# Patient Record
Sex: Male | Born: 1980 | Race: White | Hispanic: No | Marital: Single | Smoking: Never smoker
Health system: Southern US, Community
[De-identification: ages and names within clinical notes are randomized; demographics above are authoritative.]

## PROBLEM LIST (undated history)

## (undated) DIAGNOSIS — N200 Calculus of kidney: Secondary | ICD-10-CM

## (undated) DIAGNOSIS — I10 Essential (primary) hypertension: Secondary | ICD-10-CM

## (undated) DIAGNOSIS — N289 Disorder of kidney and ureter, unspecified: Secondary | ICD-10-CM

## (undated) DIAGNOSIS — E162 Hypoglycemia, unspecified: Secondary | ICD-10-CM

## (undated) HISTORY — PX: EYE SURGERY: SHX253

## (undated) HISTORY — PX: LITHOTRIPSY: SUR834

## (undated) HISTORY — PX: KNEE SURGERY: SHX244

---

## 2013-08-29 ENCOUNTER — Encounter (HOSPITAL_COMMUNITY): Payer: Self-pay | Admitting: Emergency Medicine

## 2013-08-29 ENCOUNTER — Emergency Department (HOSPITAL_COMMUNITY)

## 2013-08-29 ENCOUNTER — Emergency Department (HOSPITAL_COMMUNITY)
Admission: EM | Admit: 2013-08-29 | Discharge: 2013-08-29 | Disposition: A | Discharge status: Home / Self Care | Attending: Emergency Medicine | Admitting: Emergency Medicine

## 2013-08-29 DIAGNOSIS — S61219A Laceration without foreign body of unspecified finger without damage to nail, initial encounter: Secondary | ICD-10-CM

## 2013-08-29 DIAGNOSIS — Z87442 Personal history of urinary calculi: Secondary | ICD-10-CM | POA: Insufficient documentation

## 2013-08-29 DIAGNOSIS — Z79899 Other long term (current) drug therapy: Secondary | ICD-10-CM | POA: Insufficient documentation

## 2013-08-29 DIAGNOSIS — Z862 Personal history of diseases of the blood and blood-forming organs and certain disorders involving the immune mechanism: Secondary | ICD-10-CM | POA: Insufficient documentation

## 2013-08-29 DIAGNOSIS — S61209A Unspecified open wound of unspecified finger without damage to nail, initial encounter: Secondary | ICD-10-CM | POA: Insufficient documentation

## 2013-08-29 DIAGNOSIS — Y9389 Activity, other specified: Secondary | ICD-10-CM | POA: Insufficient documentation

## 2013-08-29 DIAGNOSIS — I10 Essential (primary) hypertension: Secondary | ICD-10-CM | POA: Insufficient documentation

## 2013-08-29 DIAGNOSIS — Y929 Unspecified place or not applicable: Secondary | ICD-10-CM | POA: Insufficient documentation

## 2013-08-29 DIAGNOSIS — W230XXA Caught, crushed, jammed, or pinched between moving objects, initial encounter: Secondary | ICD-10-CM | POA: Insufficient documentation

## 2013-08-29 DIAGNOSIS — Z8639 Personal history of other endocrine, nutritional and metabolic disease: Secondary | ICD-10-CM | POA: Insufficient documentation

## 2013-08-29 HISTORY — DX: Essential (primary) hypertension: I10

## 2013-08-29 HISTORY — DX: Disorder of kidney and ureter, unspecified: N28.9

## 2013-08-29 HISTORY — DX: Hypoglycemia, unspecified: E16.2

## 2013-08-29 MED ORDER — IBUPROFEN 800 MG PO TABS
800.0000 mg | ORAL_TABLET | Freq: Once | ORAL | Status: AC
  Administered 2013-08-29: 800 mg via ORAL
  Filled 2013-08-29: qty 1

## 2013-08-29 NOTE — ED Notes (Signed)
Pt reports accidentally got left ring finger caught between 2 dumbells.  Pt has laceration to left ring finger.

## 2013-08-29 NOTE — ED Provider Notes (Signed)
CSN: [id number]     Arrival date & time 08/29/13  1301 History   First MD Initiated Contact with Patient 08/29/13 1522     Chief Complaint  Patient presents with  . Laceration   (Consider location/radiation/quality/duration/timing/severity/associated sxs/prior Treatment) Patient is a 32 y.o. male presenting with skin laceration. The history is provided by the patient.  Laceration Location:  Finger Finger laceration location:  L ring finger Depth:  Cutaneous Quality: jagged   Bleeding: controlled   Injury mechanism: finger mashed between 2 dumbells. Pain details:    Quality:  Aching   Severity:  Moderate   Timing:  Constant   Progression:  Unchanged Foreign body present:  No foreign bodies Worsened by:  Movement Tetanus status:  Up to date   Past Medical History  Diagnosis Date  . Hypertension   . Hypoglycemia   . Renal disorder     kidney stones   Past Surgical History  Procedure Laterality Date  . Knee surgery    . Lithotripsy    . Eye surgery     No family history on file. History  Substance Use Topics  . Smoking status: Never Smoker   . Smokeless tobacco: Not on file  . Alcohol Use: No    Review of Systems  Constitutional: Negative for activity change.       All ROS Neg except as noted in HPI  HENT: Negative for nosebleeds.   Eyes: Negative for photophobia and discharge.  Respiratory: Negative for cough, shortness of breath and wheezing.   Cardiovascular: Negative for chest pain and palpitations.  Gastrointestinal: Negative for abdominal pain and blood in stool.  Genitourinary: Negative for dysuria, frequency and hematuria.  Musculoskeletal: Negative for arthralgias, back pain and neck pain.  Skin: Negative.   Neurological: Negative for dizziness, seizures and speech difficulty.  Psychiatric/Behavioral: Negative for hallucinations and confusion.    Allergies  Review of patient's allergies indicates no known allergies.  Home Medications   Current  Outpatient Rx  Name  Route  Sig  Dispense  Refill  . atenolol (TENORMIN) 25 MG tablet   Oral   Take 25 mg by mouth daily.         Marland Kitchen triamterene-hydrochlorothiazide (MAXZIDE-25) 37.5-25 MG per tablet   Oral   Take 1 tablet by mouth daily.          BP 172/93  Pulse 71  Temp(Src) 98.2 F (36.8 C) (Oral)  Resp 20  Ht 6\' 2"  (1.88 m)  Wt 280 lb (127.007 kg)  BMI 35.93 kg/m2  SpO2 97% Physical Exam  Nursing note and vitals reviewed. Constitutional: He is oriented to person, place, and time. He appears well-developed and well-nourished.  Non-toxic appearance.  HENT:  Head: Normocephalic.  Right Ear: Tympanic membrane and external ear normal.  Left Ear: Tympanic membrane and external ear normal.  Eyes: EOM and lids are normal. Pupils are equal, round, and reactive to light.  Neck: Normal range of motion. Neck supple. Carotid bruit is not present.  Cardiovascular: Normal rate, regular rhythm, normal heart sounds, intact distal pulses and normal pulses.   Pulmonary/Chest: Breath sounds normal. No respiratory distress.  Abdominal: Soft. Bowel sounds are normal. There is no tenderness. There is no guarding.  Musculoskeletal: Normal range of motion.  2.6cm laceration of the left ring finger. Good cap refill. No tendon involvement.    Lymphadenopathy:       Head (right side): No submandibular adenopathy present.       Head (left side): No  submandibular adenopathy present.    He has no cervical adenopathy.  Neurological: He is alert and oriented to person, place, and time. He has normal strength. No cranial nerve deficit or sensory deficit.  Skin: Skin is warm and dry.  Psychiatric: He has a normal mood and affect. His speech is normal.    ED Course  LACERATION REPAIR Date/Time: 08/29/2013 4:30 PM Performed by: Kathie Dike Authorized by: Kathie Dike Consent: Verbal consent obtained. Risks and benefits: risks, benefits and alternatives were discussed Consent given by:  patient Patient understanding: patient states understanding of the procedure being performed Patient identity confirmed: arm band Time out: Immediately prior to procedure a "time out" was called to verify the correct patient, procedure, equipment, support staff and site/side marked as required. Body area: upper extremity Location details: left index finger Laceration length: 2.6 cm Foreign bodies: no foreign bodies Tendon involvement: none Nerve involvement: none Anesthesia: digital block Local anesthetic: bupivacaine 0.25% without epinephrine Patient sedated: no Preparation: Patient was prepped and draped in the usual sterile fashion. Irrigation solution: saline Amount of cleaning: standard Debridement: none Skin closure: 4-0 nylon Number of sutures: 8 Technique: simple Approximation: close Approximation difficulty: simple Dressing: splint Patient tolerance: Patient tolerated the procedure well with no immediate complications.   (including critical care time) Labs Review Labs Reviewed - No data to display Imaging Review Dg Finger Ring Left  08/29/2013   CLINICAL DATA:  Crush injury of the left ring finger  EXAM: LEFT RING FINGER 2+V  COMPARISON:  None  FINDINGS: There is no evidence of fracture or dislocation. There is no evidence of arthropathy or other focal bone abnormality. Soft tissues are unremarkable.  IMPRESSION: There is no evidence of acute fracture or dislocation of the left 4th finger.   Electronically Signed   By: David  [place]   On: 08/29/2013 13:36    EKG Interpretation   None       MDM  No diagnosis found. *I have reviewed nursing notes, vital signs, and all appropriate lab and imaging results for this patient.**  X-ray of the left ring finger is negative for fracture or dislocation.  Laceration to the left ring finger repaired without problem. Dressing and splint applied to the ring finger. Patient instructed to have sutures removed in 7 days. He will  return sooner if any changes, or signs of infection.  Kathie Dike, PA-C 08/29/13 1712

## 2013-08-30 NOTE — ED Provider Notes (Signed)
Medical screening examination/treatment/procedure(s) were performed by non-physician practitioner and as supervising physician I was immediately available for consultation/collaboration.  EKG Interpretation   None        Donnetta Hutching, MD 08/30/13 1457

## 2014-02-22 ENCOUNTER — Emergency Department (HOSPITAL_COMMUNITY)
Admission: EM | Admit: 2014-02-22 | Discharge: 2014-02-23 | Attending: Emergency Medicine | Admitting: Emergency Medicine

## 2014-02-22 ENCOUNTER — Emergency Department (HOSPITAL_COMMUNITY)

## 2014-02-22 ENCOUNTER — Encounter (HOSPITAL_COMMUNITY): Payer: Self-pay | Admitting: Emergency Medicine

## 2014-02-22 DIAGNOSIS — Z79899 Other long term (current) drug therapy: Secondary | ICD-10-CM | POA: Insufficient documentation

## 2014-02-22 DIAGNOSIS — G43909 Migraine, unspecified, not intractable, without status migrainosus: Secondary | ICD-10-CM | POA: Insufficient documentation

## 2014-02-22 DIAGNOSIS — M542 Cervicalgia: Secondary | ICD-10-CM | POA: Insufficient documentation

## 2014-02-22 DIAGNOSIS — G43009 Migraine without aura, not intractable, without status migrainosus: Secondary | ICD-10-CM

## 2014-02-22 DIAGNOSIS — R42 Dizziness and giddiness: Secondary | ICD-10-CM

## 2014-02-22 DIAGNOSIS — Z87442 Personal history of urinary calculi: Secondary | ICD-10-CM | POA: Insufficient documentation

## 2014-02-22 DIAGNOSIS — I10 Essential (primary) hypertension: Secondary | ICD-10-CM | POA: Insufficient documentation

## 2014-02-22 DIAGNOSIS — R112 Nausea with vomiting, unspecified: Secondary | ICD-10-CM | POA: Insufficient documentation

## 2014-02-22 HISTORY — DX: Calculus of kidney: N20.0

## 2014-02-22 LAB — BASIC METABOLIC PANEL
BUN: 16 mg/dL (ref 6–23)
CO2: 25 mEq/L (ref 19–32)
Calcium: 9.3 mg/dL (ref 8.4–10.5)
Chloride: 101 mEq/L (ref 96–112)
Creatinine, Ser: 1.09 mg/dL (ref 0.50–1.35)
GFR calc Af Amer: >90 mL/min (ref >90)
GFR calc non Af Amer: 88 mL/min — ABNORMAL LOW (ref >90)
Glucose, Bld: 147 mg/dL — ABNORMAL HIGH (ref 70–99)
Potassium: 3.9 mEq/L (ref 3.7–5.3)
Sodium: 141 mEq/L (ref 137–147)

## 2014-02-22 LAB — CBC WITH DIFFERENTIAL/PLATELET
Basophils Absolute: 0 10*3/uL (ref 0.0–0.1)
Basophils Relative: 0 % (ref 0–1)
Eosinophils Absolute: 0.1 10*3/uL (ref 0.0–0.7)
Eosinophils Relative: 1 % (ref 0–5)
HCT: 46.5 % (ref 39.0–52.0)
Hemoglobin: 15.9 g/dL (ref 13.0–17.0)
Lymphocytes Relative: 12 % (ref 12–46)
Lymphs Abs: 1.3 10*3/uL (ref 0.7–4.0)
MCH: 29.9 pg (ref 26.0–34.0)
MCHC: 34.2 g/dL (ref 30.0–36.0)
MCV: 87.6 fL (ref 78.0–100.0)
Monocytes Absolute: 0.5 10*3/uL (ref 0.1–1.0)
Monocytes Relative: 5 % (ref 3–12)
Neutro Abs: 8.5 10*3/uL — ABNORMAL HIGH (ref 1.7–7.7)
Neutrophils Relative %: 82 % — ABNORMAL HIGH (ref 43–77)
Platelets: 240 10*3/uL (ref 150–400)
RBC: 5.31 MIL/uL (ref 4.22–5.81)
RDW: 12.7 % (ref 11.5–15.5)
WBC: 10.3 10*3/uL (ref 4.0–10.5)

## 2014-02-22 LAB — CK: Total CK: 631 U/L — ABNORMAL HIGH (ref 7–232)

## 2014-02-22 MED ORDER — DIPHENHYDRAMINE HCL 50 MG/ML IJ SOLN
25.0000 mg | Freq: Once | INTRAMUSCULAR | Status: AC
  Administered 2014-02-22: 25 mg via INTRAVENOUS
  Filled 2014-02-22: qty 1

## 2014-02-22 MED ORDER — ONDANSETRON 4 MG PO TBDP: 4.0000 mg | ORAL_TABLET | Freq: Three times a day (TID) | ORAL | Status: AC | PRN

## 2014-02-22 MED ORDER — HYDROMORPHONE HCL PF 1 MG/ML IJ SOLN
1.0000 mg | Freq: Once | INTRAMUSCULAR | Status: AC
Start: 2014-02-22 — End: 2014-02-22
  Administered 2014-02-22: 1 mg via INTRAVENOUS
  Filled 2014-02-22: qty 1

## 2014-02-22 MED ORDER — DEXAMETHASONE SODIUM PHOSPHATE 4 MG/ML IJ SOLN
10.0000 mg | Freq: Once | INTRAMUSCULAR | Status: AC
  Administered 2014-02-22: 10 mg via INTRAVENOUS
  Filled 2014-02-22: qty 3

## 2014-02-22 MED ORDER — MECLIZINE HCL 12.5 MG PO TABS
25.0000 mg | ORAL_TABLET | Freq: Once | ORAL | Status: AC
  Administered 2014-02-22: 25 mg via ORAL
  Filled 2014-02-22: qty 2

## 2014-02-22 MED ORDER — ONDANSETRON HCL 4 MG/2ML IJ SOLN
4.0000 mg | Freq: Once | INTRAMUSCULAR | Status: AC
  Administered 2014-02-22: 4 mg via INTRAVENOUS
  Filled 2014-02-22: qty 2

## 2014-02-22 MED ORDER — MECLIZINE HCL 25 MG PO TABS: 25.0000 mg | ORAL_TABLET | Freq: Four times a day (QID) | ORAL | Status: AC

## 2014-02-22 MED ORDER — SODIUM CHLORIDE 0.9 % IV BOLUS (SEPSIS)
1000.0000 mL | Freq: Once | INTRAVENOUS | Status: AC
  Administered 2014-02-22: 1000 mL via INTRAVENOUS

## 2014-02-22 MED ORDER — SODIUM CHLORIDE 0.9 % IV SOLN
INTRAVENOUS | Status: DC
  Administered 2014-02-22: 100 mL/h via INTRAVENOUS

## 2014-02-22 MED ORDER — PROMETHAZINE HCL 25 MG PO TABS
25.0000 mg | ORAL_TABLET | Freq: Four times a day (QID) | ORAL | Status: AC | PRN
Start: 2014-02-22 — End: ?

## 2014-02-22 MED ORDER — PROMETHAZINE HCL 25 MG/ML IJ SOLN
12.5000 mg | Freq: Once | INTRAMUSCULAR | Status: AC
  Administered 2014-02-22: 12.5 mg via INTRAVENOUS
  Filled 2014-02-22: qty 1

## 2014-02-22 NOTE — ED Notes (Addendum)
[facility] where he works in [employer]. Pt became lightheaded and dizzy. No LOC. PMH of hypoglycemia.

## 2014-02-22 NOTE — ED Notes (Signed)
[employer]ison Guard came to door of patient room and stated that patient needed some assistance. Patient had vomited 300 cc in vomit bag. RN Zella Ballobin and Asher MuirJamie made aware.

## 2014-02-22 NOTE — Discharge Instructions (Signed)
Benign Positional Vertigo Vertigo means you feel like you or your surroundings are moving when they are not. Benign positional vertigo is the most common form of vertigo. Benign means that the cause of your condition is not serious. Benign positional vertigo is more common in older adults. CAUSES  Benign positional vertigo is the result of an upset in the labyrinth system. This is an area in the middle ear that helps control your balance. This may be caused by a viral infection, head injury, or repetitive motion. However, often no specific cause is found. SYMPTOMS  Symptoms of benign positional vertigo occur when you move your head or eyes in different directions. Some of the symptoms may include:  Loss of balance and falls.  Vomiting.  Blurred vision.  Dizziness.  Nausea.  Involuntary eye movements (nystagmus). DIAGNOSIS  Benign positional vertigo is usually diagnosed by physical exam. If the specific cause of your benign positional vertigo is unknown, your caregiver may perform imaging tests, such as magnetic resonance imaging (MRI) or computed tomography (CT). TREATMENT  Your caregiver may recommend movements or procedures to correct the benign positional vertigo. Medicines such as meclizine, benzodiazepines, and medicines for nausea may be used to treat your symptoms. In rare cases, if your symptoms are caused by certain conditions that affect the inner ear, you may need surgery. HOME CARE INSTRUCTIONS   Follow your caregiver's instructions.  Move slowly. Do not make sudden body or head movements.  Avoid driving.  Avoid operating heavy machinery.  Avoid performing any tasks that would be dangerous to you or others during a vertigo episode.  Drink enough fluids to keep your urine clear or pale yellow. SEEK IMMEDIATE MEDICAL CARE IF:   You develop problems with walking, weakness, numbness, or using your arms, hands, or legs.  You have difficulty speaking.  You develop  severe headaches.  Your nausea or vomiting continues or gets worse.  You develop visual changes.  Your family or friends notice any behavioral changes.  Your condition gets worse.  You have a fever.  You develop a stiff neck or sensitivity to light. MAKE SURE YOU:   Understand these instructions.  Will watch your condition.  Will get help right away if you are not doing well or get worse. Document Released: 05/28/2006 Document Revised: 11/12/2011 Document Reviewed: 05/10/2011 [facility] Patient Information 2015 [employer] [place]. This information is not intended to replace advice given to you by your health care provider. Make sure you discuss any questions you have with your health care provider.  If the dizziness vertigo does not clear in 7 days will need additional workup to include an MRI. Take the anti-GERD on a regular basis. Take the Phenergan and Zofran as needed for the nausea. Headache should continue to improve over the next 24 hours. Return for any new or worse symptoms. Requesting that patient be allowed to rest with no work for the next 24 hours. Would not have him return to normal work duties until Wednesday morning.

## 2014-02-22 NOTE — ED Provider Notes (Signed)
CSN: [id number]     Arrival date & time 02/22/14  1740 History   First MD Initiated Contact with Patient 02/22/14 1803    This chart was scribed for Vanetta MuldersScott Zackowski, MD by Marica OtterNusrat Rahman, ED Scribe. This patient was seen in room APA05/APA05 and the patient's care was started at 6:39 PM.  Chief Complaint  Patient presents with  . Dizziness   Patient is a 33 y.o. male presenting with dizziness. The history is provided by the patient. No language interpreter was used.  Dizziness Associated symptoms: headaches, nausea and vomiting   Associated symptoms: no chest pain, no diarrhea and no shortness of breath    HPI Comments: Eugene Gardner is a 33 y.o. male, with a Hx of hypoglycemia, migraines, HTN, DM, renal disorder and kidney stones, [facility] who presents to the Emergency Department complaining of right sided/top HA, onset 2 days ago. Pt describes the HA as a throbbing pain and rates it a 8 out of 10. Pt also complains of associated lightheadedness and dizziness onset today while at work. Pt specifies that his episodes of dizziness is accompanied by a sensation of the room spinning. Pt also complains of associated nausea and one episode of emesis today. Pt reports he works in [employer] at [employer] and reports it is well over 100 degrees in the workspace. Pt denies LOC.   Past Medical History  Diagnosis Date  . Hypertension   . Hypoglycemia   . Renal disorder     kidney stones  . Kidney stones    Past Surgical History  Procedure Laterality Date  . Knee surgery    . Lithotripsy    . Eye surgery     No family history on file. History  Substance Use Topics  . Smoking status: Never Smoker   . Smokeless tobacco: Not on file  . Alcohol Use: No    Review of Systems  Constitutional: Negative for fever and chills.  HENT: Negative for rhinorrhea and sore throat.   Eyes: Negative for visual disturbance.   Respiratory: Negative for cough and shortness of breath.   Cardiovascular: Negative for chest pain.  Gastrointestinal: Positive for nausea and vomiting. Negative for abdominal pain and diarrhea.  Genitourinary: Negative for dysuria.  Musculoskeletal: Positive for neck pain. Negative for back pain and joint swelling.  Skin: Negative for rash.  Neurological: Positive for dizziness, light-headedness and headaches.  Hematological: Does not bruise/bleed easily.  Psychiatric/Behavioral: Negative for confusion.   Allergies  Review of patient's allergies indicates no known allergies.  Home Medications   Prior to Admission medications   Medication Sig Start Date End Date Taking? Authorizing Provider  atenolol (TENORMIN) 25 MG tablet Take 25 mg by mouth daily.   Yes Historical Provider, MD  ibuprofen (ADVIL,MOTRIN) 200 MG tablet Take 400 mg by mouth every 6 (six) hours as needed.   Yes Historical Provider, MD  triamterene-hydrochlorothiazide (MAXZIDE-25) 37.5-25 MG per tablet Take 1 tablet by mouth daily.   Yes Historical Provider, MD  meclizine (ANTIVERT) 25 MG tablet Take 1 tablet (25 mg total) by mouth 4 (four) times daily. 02/22/14   Vanetta MuldersScott Zackowski, MD  ondansetron (ZOFRAN ODT) 4 MG disintegrating tablet Take 1 tablet (4 mg total) by mouth every 8 (eight) hours as needed. 02/22/14   Vanetta MuldersScott Zackowski, MD  promethazine (PHENERGAN) 25 MG tablet Take 1 tablet (25 mg total) by mouth every 6 (six) hours as needed for nausea  or vomiting. 02/22/14   Vanetta MuldersScott Zackowski, MD   Triage Vitals: BP 140/79  Pulse 71  Temp(Src) 97.4 F (36.3 C) (Oral)  Resp 14  Ht 6\' 1"  (1.854 m)  Wt 270 lb (122.471 kg)  BMI 35.63 kg/m2  SpO2 99% Physical Exam  Nursing note and vitals reviewed. Constitutional: He is oriented to person, place, and time. He appears well-developed and well-nourished. No distress.  HENT:  Head: Normocephalic and atraumatic.  Eyes: Conjunctivae and EOM are normal.  Neck: Neck supple. No  tracheal deviation present.  Cardiovascular: Normal rate and regular rhythm.   Pulmonary/Chest: Effort normal and breath sounds normal. No respiratory distress.  Abdominal: Soft. Bowel sounds are normal. There is no tenderness.  Musculoskeletal: Normal range of motion. He exhibits no edema.  Neurological: He is alert and oriented to person, place, and time. No cranial nerve deficit. Coordination normal.  Skin: Skin is warm and dry.  Psychiatric: He has a normal mood and affect. His behavior is normal.    ED Course  Procedures (including critical care time) DIAGNOSTIC STUDIES: Oxygen Saturation is 99% on RA, normal by my interpretation.    COORDINATION OF CARE: 6:47 PM-Discussed treatment plan which includes meds, labs and imaging with pt at bedside and pt agreed to plan.   Labs Review Labs Reviewed  CBC WITH DIFFERENTIAL - Abnormal; Notable for the following:    Neutrophils Relative % 82 (*)    Neutro Abs 8.5 (*)    All other components within normal limits  BASIC METABOLIC PANEL - Abnormal; Notable for the following:    Glucose, Bld 147 (*)    GFR calc non Af Amer 88 (*)    All other components within normal limits  CK - Abnormal; Notable for the following:    Total CK 631 (*)    All other components within normal limits   Results for orders placed during the hospital encounter of 02/22/14  CBC WITH DIFFERENTIAL      Result Value Ref Range   WBC 10.3  4.0 - 10.5 K/uL   RBC 5.31  4.22 - 5.81 MIL/uL   Hemoglobin 15.9  13.0 - 17.0 g/dL   HCT 16.146.5  09.639.0 - 04.552.0 %   MCV 87.6  78.0 - 100.0 fL   MCH 29.9  26.0 - 34.0 pg   MCHC 34.2  30.0 - 36.0 g/dL   RDW 40.912.7  81.111.5 - 91.415.5 %   Platelets 240  150 - 400 K/uL   Neutrophils Relative % 82 (*) 43 - 77 %   Neutro Abs 8.5 (*) 1.7 - 7.7 K/uL   Lymphocytes Relative 12  12 - 46 %   Lymphs Abs 1.3  0.7 - 4.0 K/uL   Monocytes Relative 5  3 - 12 %   Monocytes Absolute 0.5  0.1 - 1.0 K/uL   Eosinophils Relative 1  0 - 5 %   Eosinophils  Absolute 0.1  0.0 - 0.7 K/uL   Basophils Relative 0  0 - 1 %   Basophils Absolute 0.0  0.0 - 0.1 K/uL  BASIC METABOLIC PANEL      Result Value Ref Range   Sodium 141  137 - 147 mEq/L   Potassium 3.9  3.7 - 5.3 mEq/L   Chloride 101  96 - 112 mEq/L   CO2 25  19 - 32 mEq/L   Glucose, Bld 147 (*) 70 - 99 mg/dL   BUN 16  6 - 23 mg/dL   Creatinine, Ser 7.821.09  0.50 - 1.35 mg/dL   Calcium 9.3  8.4 - 16.1 mg/dL   GFR calc non Af Amer 88 (*) >90 mL/min   GFR calc Af Amer >90  >90 mL/min  CK      Result Value Ref Range   Total CK 631 (*) 7 - 232 U/L     Imaging Review Ct Head Wo Contrast  02/22/2014   CLINICAL DATA:  Dizziness  EXAM: CT HEAD WITHOUT CONTRAST  TECHNIQUE: Contiguous axial images were obtained from the base of the skull through the vertex without intravenous contrast.  COMPARISON:  None.  FINDINGS: There is no acute intracranial hemorrhage or infarct. No mass lesion or midline shift. Gray-white matter differentiation is well maintained. Ventricles are normal in size without evidence of hydrocephalus. CSF containing spaces are within normal limits. No extra-axial fluid collection.  The calvarium is intact.  Orbital soft tissues are within normal limits.  The paranasal sinuses and mastoid air cells are well pneumatized and free of fluid.  Scalp soft tissues are unremarkable.  IMPRESSION: Normal head CT with no acute intracranial abnormality identified.   Electronically Signed   By: Rise Mu M.D.   On: 02/22/2014 20:21     EKG Interpretation None      MDM   Final diagnoses:  Nonintractable migraine, unspecified migraine type  Vertigo    Patient did get overheated with working. But no evidence of any significant heat stroke or rhabdomyolysis. Head CT negative total CK was only 631. Electrolytes without any sniffing abnormality no leukocytosis. No fever. Normal vital signs. Patient's had a history of migraines in the past headache was similar to that that's improved  with a migraine cocktail of Decadron Phenergan and Benadryl. Headache is improved significantly. The patient also had symptoms of vertigo given anti-birds some improvement with that but if he moves his head suddenly that still occurs. This is consistent with a by a benign positional vertigo. Patient is nausea and vomiting improved but not completely resolved. Treated here with Phenergan and Zofran.  [facility] Patient should have no work duties at all and should be allowed to rest until Wednesday morning. Patient sent back with Antivert Phenergan and Zofran. If patient's vertigo symptoms continue beyond 7 days he will require an MRI and additional workup. Patient knows to return for any newer worse symptoms.  I personally performed the services described in this documentation, which was scribed in my presence. The recorded information has been reviewed and is accurate.     Vanetta Mulders, MD 02/22/14 [id number]

## 2014-02-23 NOTE — ED Notes (Signed)
[facility]  Pt left in police Custody.

## 2016-01-15 IMAGING — CT CT HEAD W/O CM
1 series · 16 of 30 positions shown, 20 images · non-contrast
Comparison: None.

CLINICAL DATA: Dizziness

EXAM:
CT HEAD WITHOUT CONTRAST
TECHNIQUE: Contiguous axial images were obtained from the base of the skull
through the vertex without intravenous contrast.

[Series 2: headseq 4.8 h37s · axial · 0.47mm/px · z∈[+127,+285]mm · 16 of 36 slices shown, 20 images]
[im 2/36  brain]
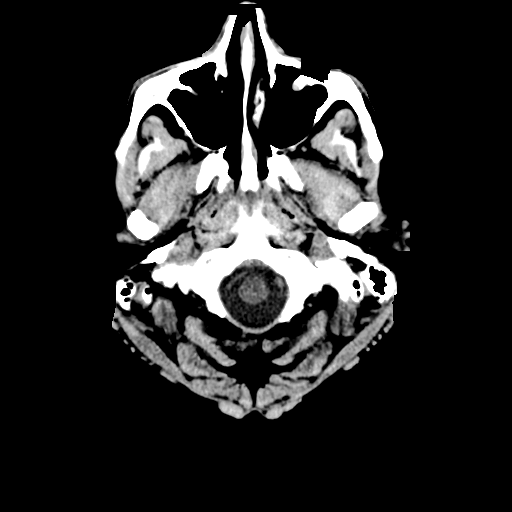
[im 2/36  bone]
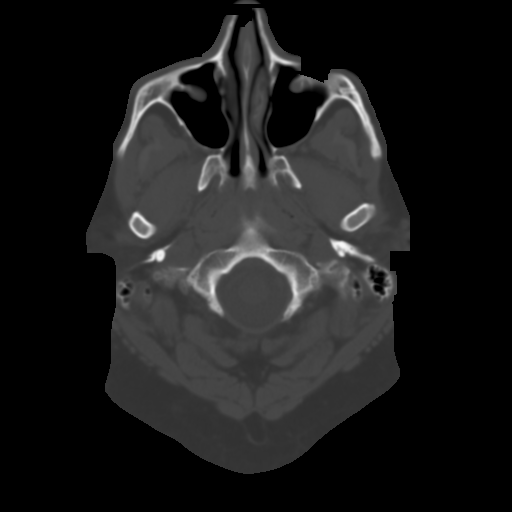
[im 4/36  brain]
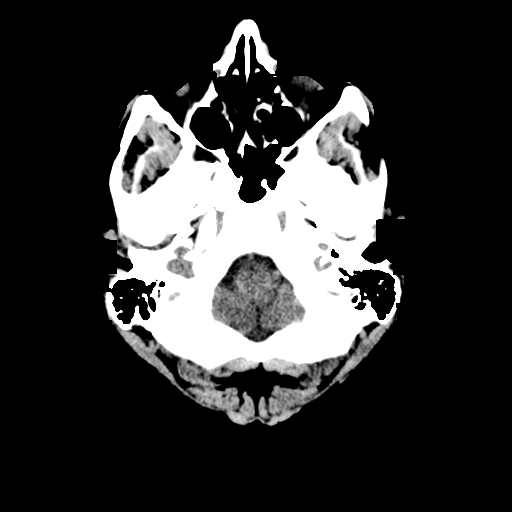
[im 7/36  brain]
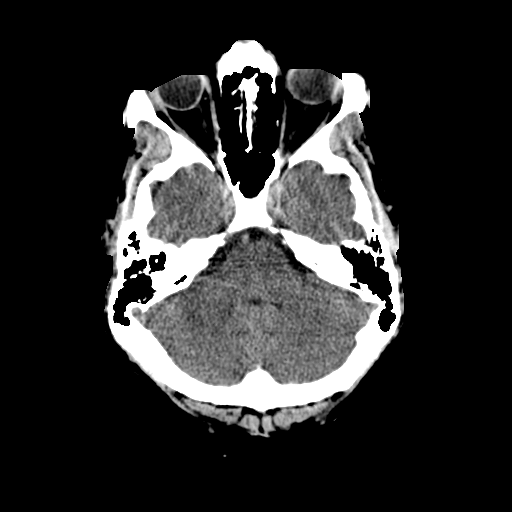
[im 9/36  brain]
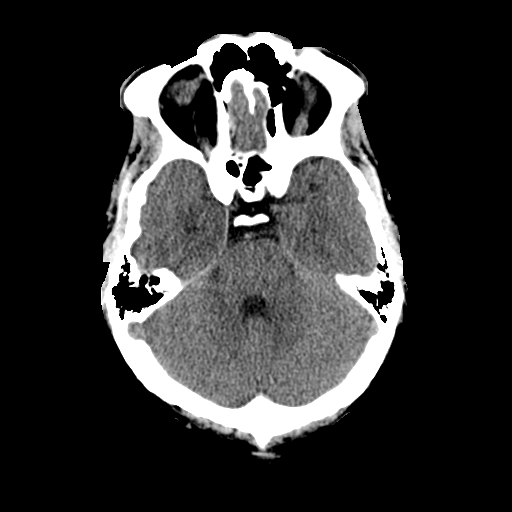
[im 10/36  brain]
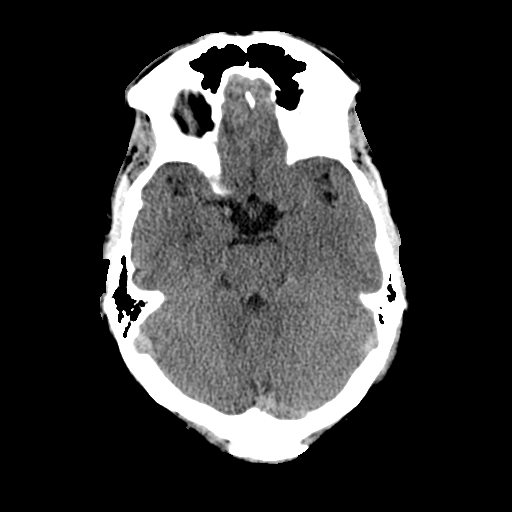
[im 10/36  bone]
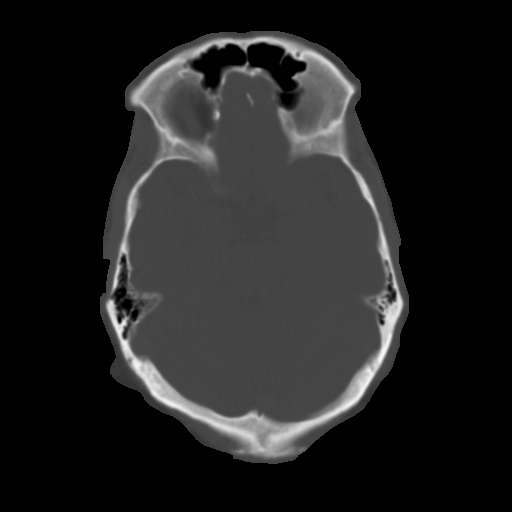
[im 13/36  brain]
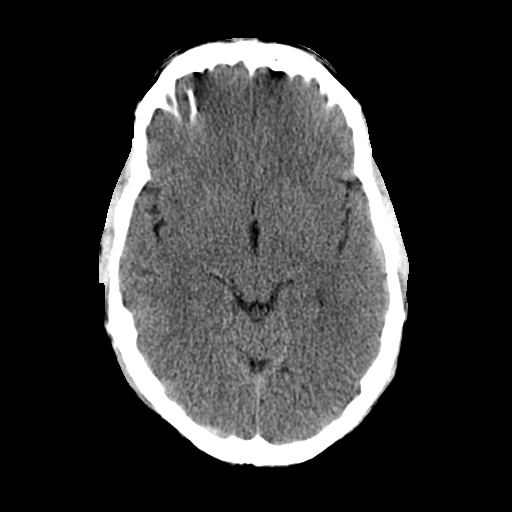
[im 15/36  brain]
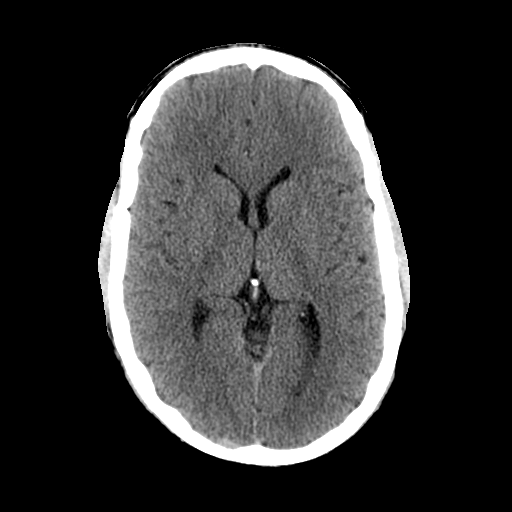
[im 17/36  brain]
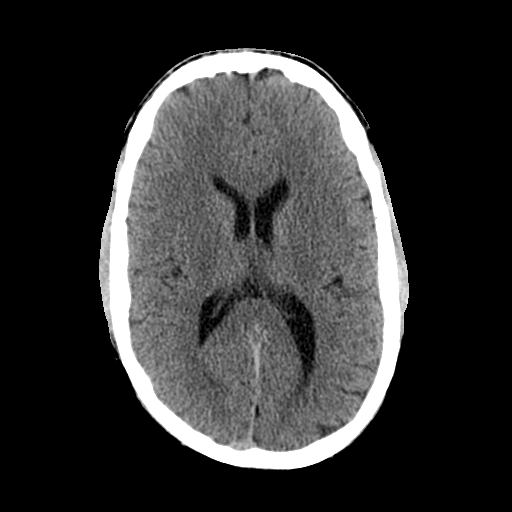
[im 19/36  brain]
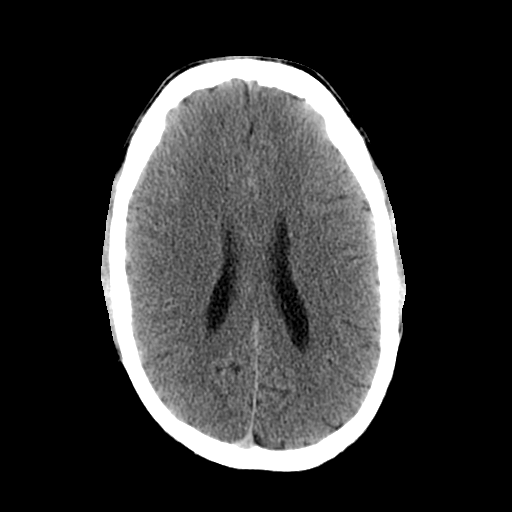
[im 19/36  bone]
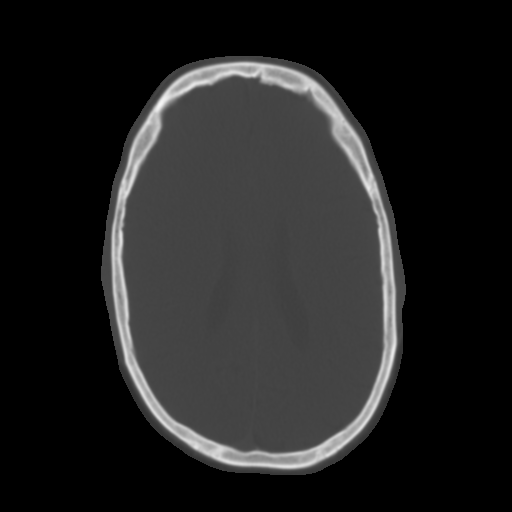
[im 21/36  brain]
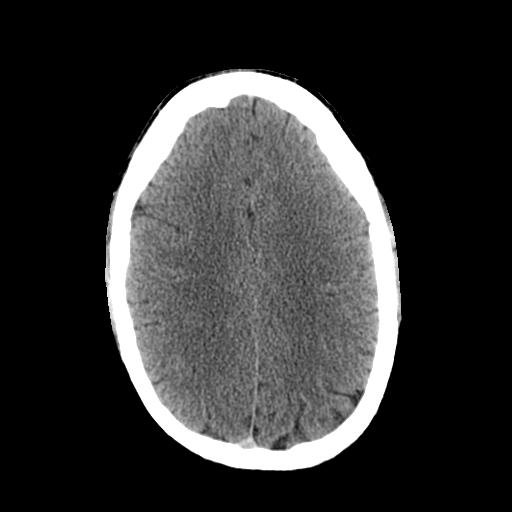
[im 23/36  brain]
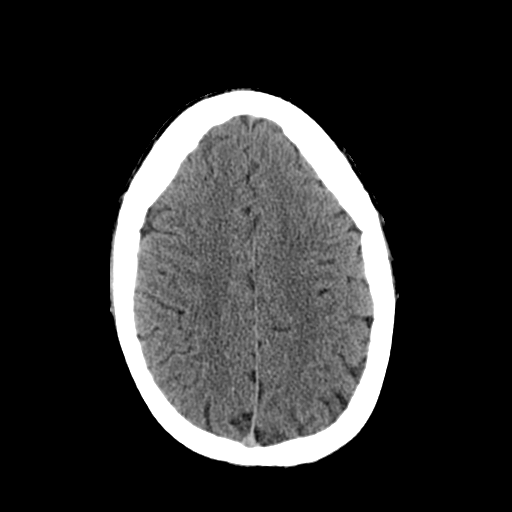
[im 26/36  brain]
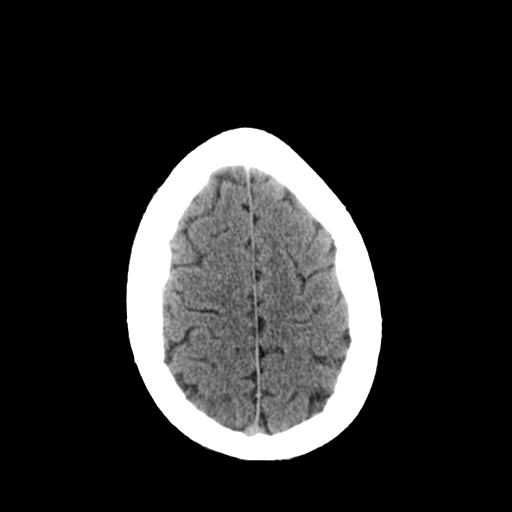
[im 27/36  brain]
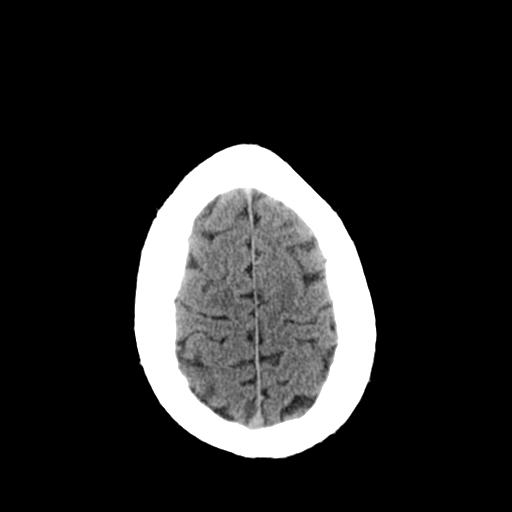
[im 27/36  bone]
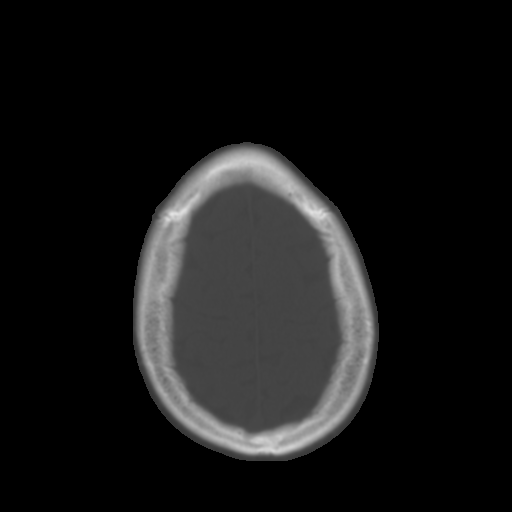
[im 29/36  brain]
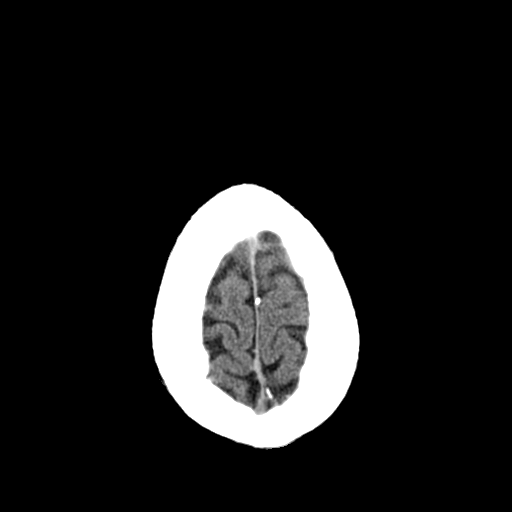
[im 32/36  brain]
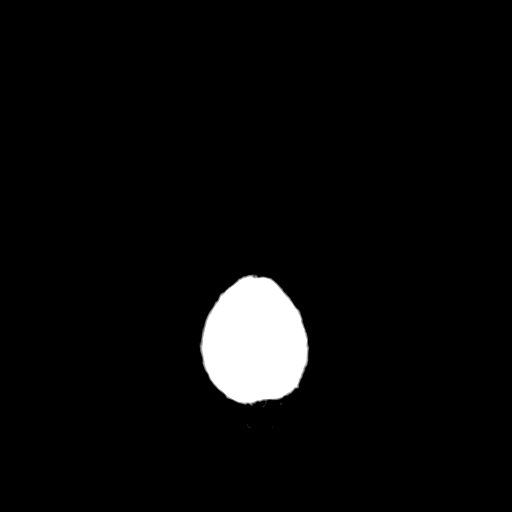
[im 34/36  brain]
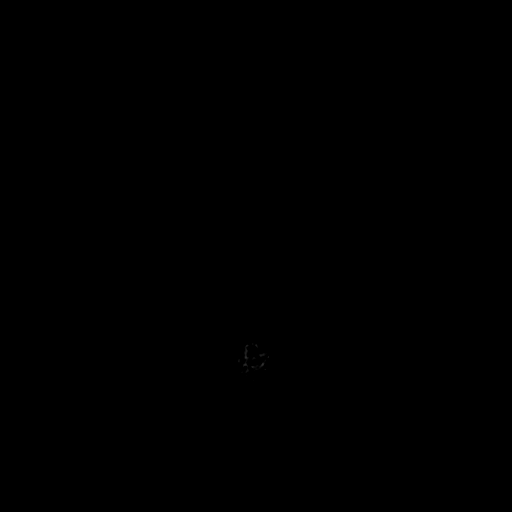

[16 of 30 positions shown; findings below may reference images not displayed]

FINDINGS: There is no acute intracranial hemorrhage or infarct. No mass lesion
or midline shift. Gray-white matter differentiation is well
maintained. Ventricles are normal in size without evidence of
hydrocephalus. CSF containing spaces are within normal limits. No
extra-axial fluid collection.

The calvarium is intact.

Orbital soft tissues are within normal limits.

The paranasal sinuses and mastoid air cells are well pneumatized and
free of fluid.

Scalp soft tissues are unremarkable.
IMPRESSION: Normal head CT with no acute intracranial abnormality identified.

## 2020-09-03 DEATH — deceased
